# Patient Record
Sex: Female | Born: 1965 | Race: White | Hispanic: Yes | Marital: Single
Health system: Midwestern US, Community
[De-identification: ages and names within clinical notes are randomized; demographics above are authoritative.]

## PROBLEM LIST (undated history)

## (undated) DIAGNOSIS — D72829 Elevated white blood cell count, unspecified: Principal | ICD-10-CM

## (undated) DIAGNOSIS — E039 Hypothyroidism, unspecified: Secondary | ICD-10-CM

## (undated) DIAGNOSIS — R7989 Other specified abnormal findings of blood chemistry: Principal | ICD-10-CM

## (undated) DIAGNOSIS — E785 Hyperlipidemia, unspecified: Principal | ICD-10-CM

## (undated) DIAGNOSIS — Z1159 Encounter for screening for other viral diseases: Secondary | ICD-10-CM

## (undated) DIAGNOSIS — Z5181 Encounter for therapeutic drug level monitoring: Secondary | ICD-10-CM

---

## 2018-08-29 LAB — FECAL DNA COLORECTAL CANCER SCREENING (COLOGUARD): FIT-DNA (Cologuard): NEGATIVE

## 2022-03-13 ENCOUNTER — Emergency Department: Admit: 2022-03-14 | Payer: BLUE CROSS/BLUE SHIELD

## 2022-03-13 DIAGNOSIS — K0889 Other specified disorders of teeth and supporting structures: Secondary | ICD-10-CM

## 2022-03-13 NOTE — ED Provider Notes (Signed)
[facility] EMERGENCY DEPT  EMERGENCY DEPARTMENT HISTORY AND PHYSICAL EXAM      Date: 03/13/2022  Patient Name: Ashley Carey  MRN: [id number]  Birthdate 02-25-1966  Date of evaluation: 03/13/2022  Provider: Vladimir Crofts, MD   Note Started: 10:44 PM EDT 03/13/22    HISTORY OF PRESENT ILLNESS     Chief Complaint   Patient presents with    Dental Pain       History Provided By: Patient    HPI: Ashley Carey is a 56 y.o. female with PMH of anxiety, depression, and hypothyroidism who comes to the ED with dental pain and facial swelling.  She reports that she had surgery in her molars in the left lower side approximately 2 weeks ago.  Today she started having swelling in the jaw area in the neck area.  Nothing specific that makes it better or worse associated with some discomfort when she is moving her neck.  She reported chills at home but no fever.  Did not measure her temperature.  She called her surgeon today who told her everything is fine and she has follow-up which will be in September.  She denies any trouble with breathing, painful swallowing, or difficulty with opening her jaw.  She however is reporting that she has the discomfort when she is turning her neck to the left side.  She tried taking Tylenol and Motrin at home without significant movement and thus came to the ED.    PAST MEDICAL HISTORY   Past Medical History:  Past Medical History:   Diagnosis Date    Anxiety     Depression     Hypothyroid        Past Surgical History:  Past Surgical History:   Procedure Laterality Date    CHOLECYSTECTOMY      OVARY REMOVAL         Family History:  History reviewed. No pertinent family history.    Social History:  Social History     Tobacco Use    Smoking status: Never   Substance Use Topics    Alcohol use: Not Currently    Drug use: Never       Allergies:  Allergies   Allergen Reactions    Pcn [Penicillins] Dizziness or Vertigo       PCP: None None    Current Meds:   No current facility-administered medications for  this encounter.     Current Outpatient Medications   Medication Sig Dispense Refill    acetaminophen (TYLENOL) 500 MG tablet Take 2 tablets by mouth 3 times daily 40 tablet 0    amoxicillin (AMOXIL) 500 MG capsule Take 1 capsule by mouth 3 times daily for 7 days 21 capsule 0    chlorhexidine (PERIDEX) 0.12 % solution Swish and spit 15 mLs 3 times daily for 14 days 630 mL 0    ibuprofen (ADVIL;MOTRIN) 600 MG tablet Take 1 tablet by mouth every 6 hours as needed for Pain or Fever 20 tablet 0       Social Determinants of Health:   Social Determinants of Health     Tobacco Use: Unknown    Smoking Tobacco Use: Never    Smokeless Tobacco Use: Unknown    Passive Exposure: Not on file   Alcohol Use: Not on file   Financial Resource Strain: Not on file   Food Insecurity: Not on file   Transportation Needs: Not on file   Physical Activity: Not on file  Stress: Not on file   Social Connections: Not on file   Intimate Partner Violence: Not on file   Depression: Not on file   Housing Stability: Not on file       PHYSICAL EXAM   Physical Exam  Vitals and nursing note reviewed.   Constitutional:       General: She is not in acute distress.     Appearance: Normal appearance. She is not ill-appearing, toxic-appearing or diaphoretic.   HENT:      Head: Normocephalic and atraumatic.      Mouth/Throat:      Dentition: Abnormal dentition. Does not have dentures. No dental tenderness, gingival swelling, dental caries, dental abscesses or gum lesions.      Comments: Mild swelling at the jaw area on the left at the angle of mandible.  Mild tenderness to palpation.  No notation of range of motion.  No trismus.  Intraoral examination did not reveal any lesions.  Floor of the mouth is soft and nontender.  Eyes:      Extraocular Movements: Extraocular movements intact.      Conjunctiva/sclera: Conjunctivae normal.   Neck:      Vascular: No carotid bruit.   Cardiovascular:      Rate and Rhythm: Normal rate and regular rhythm.   Pulmonary:       Effort: Pulmonary effort is normal.      Breath sounds: Normal breath sounds.   Musculoskeletal:      Cervical back: Normal range of motion and neck supple. No rigidity or tenderness.   Lymphadenopathy:      Cervical: No cervical adenopathy.   Neurological:      Mental Status: She is alert and oriented to person, place, and time.       SCREENINGS               LAB, EKG AND DIAGNOSTIC RESULTS   Labs:  Recent Results (from the past 12 hour(s))   CBC with Auto Differential    Collection Time: 03/13/22 10:38 PM   Result Value Ref Range    WBC 14.3 (H) 3.6 - 11.0 K/uL    RBC 4.33 3.80 - 5.20 M/uL    Hemoglobin 12.5 11.5 - 16.0 g/dL    Hematocrit 91.4 78.2 - 47.0 %    MCV 89.1 80.0 - 99.0 FL    MCH 28.9 26.0 - 34.0 PG    MCHC 32.4 30.0 - 36.5 g/dL    RDW 95.6 21.3 - 08.6 %    Platelets 306 150 - 400 K/uL    MPV 9.6 8.9 - 12.9 FL    Nucleated RBCs 0.0 0.0 PER 100 WBC    nRBC 0.00 0.00 - 0.01 K/uL    Neutrophils % 55 32 - 75 %    Lymphocytes % 28 12 - 49 %    Monocytes % 4 (L) 5 - 13 %    Eosinophils % 0 0 - 7 %    Basophils % 0 0 - 1 %    Other cells, fluid 13 %    Immature Granulocytes 0 %    Neutrophils Absolute 7.9 1.8 - 8.0 K/UL    Lymphocytes Absolute 4.0 (H) 0.8 - 3.5 K/UL    Monocytes Absolute 0.6 0.0 - 1.0 K/UL    Eosinophils Absolute 0.0 0.0 - 0.4 K/UL    Basophils Absolute 0.0 0.0 - 0.1 K/UL    Absolute Immature Granulocyte 0.0 K/UL    Differential Type Manual  RBC Comment Normocytic, Normochromic     Basic Metabolic Panel    Collection Time: 03/13/22 10:38 PM   Result Value Ref Range    Sodium 139 136 - 145 mmol/L    Potassium 4.1 3.5 - 5.1 mmol/L    Chloride 105 97 - 108 mmol/L    CO2 30 21 - 32 mmol/L    Anion Gap 4 (L) 5 - 15 mmol/L    Glucose 103 (H) 65 - 100 mg/dL    BUN 17 6 - 20 mg/dL    Creatinine 4.781.00 2.950.55 - 1.02 mg/dL    Bun/Cre Ratio 17 12 - 20      Est, Glom Filt Rate >60 >60 ml/min/1.5973m2    Calcium 9.1 8.5 - 10.1 mg/dL       Radiologic Studies:  Non-plain film images such as CT, Ultrasound and  MRI are read by the radiologist. Plain radiographic images are visualized and preliminarily interpreted by the ED Provider with the following findings: See ED Course Below    Interpretation per the Radiologist below, if available at the time of this note:  CT MAXILLOFACIAL WO CONTRAST   Final Result   No acute fracture. No focal fluid collection visualized.               EMERGENCY DEPARTMENT COURSE and DIFFERENTIAL DIAGNOSIS/MDM   CC/HPI Summary, DDx, ED Course, and Reassessment:     Clinical Management Tools:  Not Applicable    Records Reviewed (source and summary of external notes): Prior medical records and Nursing notes    Vitals:    Vitals:    03/13/22 2215 03/14/22 0130   BP: (!) 156/85 (!) 148/76   Pulse: 79 86   Resp: 18 18   Temp: 98.4 F (36.9 C)    TempSrc: Oral    SpO2: 97% 98%   Weight: 113.4 kg (250 lb)    Height: 1.702 m (5\' 7" )      56 y.o. female with PMH of anxiety, depression, and hypothyroidism who comes to the ED with dental pain and facial swelling.  She is concerned that she has a dental infection.  I appreciate any abscess on her intraoral examination.  However, she could have an occult infection.  We will get CBC and chemistry as well as CT of the face to rule out the possibility of early infection within her jaw or face.  Currently, patient symptoms stable and not requiring any further interventions.     ED COURSE    Independently reviewed interpreted patient work-up.  CBC showed mild leukocytosis.  Patient report that her white blood cells usually mildly elevated.  Chemistry is unremarkable.  CT of the face was interpreted by the radiologist as no fracture or fluid collection visualized.  In the setting of her new onset of swelling this could be an early infection within her teeth giving the recent surgery.  Will discharge patient with antibiotics, analgesics and instructions to call her dentist in the morning for arrange expedited follow-up.    After completion of workup and discussion  of results and diagnoses with the patient, all the patient's questions were answered. If required, all follow up appointments and treatments were discussed and explained. The patient sounded understanding and agrees with the plan.    The patient understands that at this time there is no evidence for a more malignant underlying process, but the patient also understands that early in the process of an illness, an emergency department workup can be falsely reassuring.  Routine discharge counseling was given to the patient and the patient understands that worsening, changing or persistent symptoms should prompt an immediate call or follow up with their primary physician or the emergency department. The importance of appropriate follow up was also discussed with the patient. More extensive discharge instructions were given in the patient's discharge paperwork.      ED FINAL IMPRESSION     1. Pain, dental          DISPOSITION/PLAN   DISPOSITION Decision To Discharge 03/14/2022 01:32:48 AM    Discharge Note: The patient is stable for discharge home. The signs, symptoms, diagnosis, and discharge instructions have been discussed, understanding conveyed, and agreed upon. The patient is to follow up as recommended or return to ER should their symptoms worsen.      PATIENT REFERRED TO:  [facility] EMERGENCY DEPT  [address] [place] [address]  [phone]  Go to   As needed, If symptoms worsen      Call your dentist today to arrange expedited follow-up for reevaluation            DISCHARGE MEDICATIONS:     Medication List        START taking these medications      acetaminophen 500 MG tablet  Commonly known as: TYLENOL  Take 2 tablets by mouth 3 times daily     amoxicillin 500 MG capsule  Commonly known as: AMOXIL  Take 1 capsule by mouth 3 times daily for 7 days     chlorhexidine 0.12 % solution  Commonly known as: PERIDEX  Swish and spit 15 mLs 3 times daily for 14 days     ibuprofen 600 MG tablet  Commonly known  as: ADVIL;MOTRIN  Take 1 tablet by mouth every 6 hours as needed for Pain or Fever               Where to Get Your Medications        These medications were sent to [id number], [place] - [address] [phone] - F [phone]  [address], [place] [address]      Phone: [phone]   acetaminophen 500 MG tablet  amoxicillin 500 MG capsule  chlorhexidine 0.12 % solution  ibuprofen 600 MG tablet           DISCONTINUED MEDICATIONS:  Discharge Medication List as of 03/14/2022  1:47 AM          I am the Primary Clinician of Record. Laqueta Bonaventura Nils Pyle, MD (electronically signed)    (Please note that parts of this dictation were completed with voice recognition software. Quite often unanticipated grammatical, syntax, homophones, and other interpretive errors are inadvertently transcribed by the computer software. Please disregards these errors. Please excuse any errors that have escaped final proofreading.)     Vladimir Crofts, MD  03/14/22 [phone]

## 2022-03-13 NOTE — ED Triage Notes (Signed)
C/o continued dental pain to left lower after tooth being pulled 1 1/2 weeks ago; states noticed swelling to neck on left today

## 2022-03-14 ENCOUNTER — Inpatient Hospital Stay
Admit: 2022-03-14 | Discharge: 2022-03-14 | Disposition: A | Discharge status: Home / Self Care | Payer: BLUE CROSS/BLUE SHIELD | Attending: Emergency Medicine

## 2022-03-14 LAB — BASIC METABOLIC PANEL
Anion Gap: 4 mmol/L — ABNORMAL LOW (ref 5–15)
BUN: 17 mg/dL (ref 6–20)
Bun/Cre Ratio: 17 (ref 12–20)
CO2: 30 mmol/L (ref 21–32)
Calcium: 9.1 mg/dL (ref 8.5–10.1)
Chloride: 105 mmol/L (ref 97–108)
Creatinine: 1 mg/dL (ref 0.55–1.02)
Est, Glom Filt Rate: >60 mL/min/{1.73_m2} (ref >60)
Glucose: 103 mg/dL — ABNORMAL HIGH (ref 65–100)
Potassium: 4.1 mmol/L (ref 3.5–5.1)
Sodium: 139 mmol/L (ref 136–145)

## 2022-03-14 LAB — CBC WITH AUTO DIFFERENTIAL
Absolute Immature Granulocyte: 0 10*3/uL
Basophils %: 0 % (ref 0–1)
Basophils Absolute: 0 10*3/uL (ref 0.0–0.1)
Eosinophils %: 0 % (ref 0–7)
Eosinophils Absolute: 0 10*3/uL (ref 0.0–0.4)
Hematocrit: 38.6 % (ref 35.0–47.0)
Hemoglobin: 12.5 g/dL (ref 11.5–16.0)
Immature Granulocytes: 0 %
Lymphocytes %: 28 % (ref 12–49)
Lymphocytes Absolute: 4 10*3/uL — ABNORMAL HIGH (ref 0.8–3.5)
MCH: 28.9 PG (ref 26.0–34.0)
MCHC: 32.4 g/dL (ref 30.0–36.5)
MCV: 89.1 FL (ref 80.0–99.0)
MPV: 9.6 FL (ref 8.9–12.9)
Monocytes %: 4 % — ABNORMAL LOW (ref 5–13)
Monocytes Absolute: 0.6 10*3/uL (ref 0.0–1.0)
Neutrophils %: 55 % (ref 32–75)
Neutrophils Absolute: 7.9 10*3/uL (ref 1.8–8.0)
Nucleated RBCs: 0 PER 100 WBC
Other cells, fluid: 13 %
Platelets: 306 10*3/uL (ref 150–400)
RBC: 4.33 M/uL (ref 3.80–5.20)
RDW: 14.1 % (ref 11.5–14.5)
WBC: 14.3 10*3/uL — ABNORMAL HIGH (ref 3.6–11.0)
nRBC: 0 10*3/uL (ref 0.00–0.01)

## 2022-03-14 MED ORDER — CHLORHEXIDINE GLUCONATE 0.12 % MT SOLN
0.12 % | Freq: Three times a day (TID) | OROMUCOSAL | 0 refills | Status: DC
Start: 2022-03-14 — End: 2022-03-14

## 2022-03-14 MED ORDER — IBUPROFEN 600 MG PO TABS
600 MG | ORAL_TABLET | Freq: Four times a day (QID) | ORAL | 0 refills | Status: DC | PRN
Start: 2022-03-14 — End: 2022-03-14

## 2022-03-14 MED ORDER — IBUPROFEN 600 MG PO TABS
600 MG | ORAL_TABLET | Freq: Four times a day (QID) | ORAL | 0 refills | Status: DC | PRN
Start: 2022-03-14 — End: 2023-08-15

## 2022-03-14 MED ORDER — CHLORHEXIDINE GLUCONATE 0.12 % MT SOLN
0.12 % | Freq: Three times a day (TID) | OROMUCOSAL | 0 refills | Status: AC
Start: 2022-03-14 — End: 2022-03-28

## 2022-03-14 MED ORDER — AMOXICILLIN 500 MG PO CAPS
500 MG | ORAL_CAPSULE | Freq: Three times a day (TID) | ORAL | 0 refills | Status: AC
Start: 2022-03-14 — End: 2022-03-21

## 2022-03-14 MED ORDER — ACETAMINOPHEN 500 MG PO TABS
500 MG | ORAL_TABLET | Freq: Three times a day (TID) | ORAL | 0 refills | Status: DC
Start: 2022-03-14 — End: 2022-03-14

## 2022-03-14 MED ORDER — ACETAMINOPHEN 500 MG PO TABS
500 MG | ORAL_TABLET | Freq: Three times a day (TID) | ORAL | 0 refills | Status: DC
Start: 2022-03-14 — End: 2023-08-15

## 2022-03-14 MED ORDER — AMOXICILLIN 500 MG PO CAPS
500 MG | ORAL_CAPSULE | Freq: Three times a day (TID) | ORAL | 0 refills | Status: DC
Start: 2022-03-14 — End: 2022-03-14

## 2022-03-14 NOTE — ED Notes (Signed)
Provider at bedside for dispo and follow up, all treatments completed as ordered. Discharge plan reviewed and paperwork signed, teaching completed regarding treatment received, medications and follow up care demonstrated and read back, IV removed, catheter intact, pain level within manageable comfortable limits, ambulatory to exit, gait steady, safety maintained.       Verdia Kuba, RN  03/14/22 [phone]

## 2023-08-15 ENCOUNTER — Encounter

## 2023-08-15 ENCOUNTER — Ambulatory Visit: Admit: 2023-08-15 | Discharge: 2023-08-15 | Payer: BLUE CROSS/BLUE SHIELD | Attending: Acute Care | Primary: Acute Care

## 2023-08-15 VITALS — BP 133/82 | HR 84 | Temp 97.90000°F | Resp 18 | Ht 67.0 in | Wt 274.0 lb

## 2023-08-15 DIAGNOSIS — R899 Unspecified abnormal finding in specimens from other organs, systems and tissues: Secondary | ICD-10-CM

## 2023-08-15 MED ORDER — NYSTATIN 100000 UNIT/GM EX POWD
100000 | Freq: Four times a day (QID) | CUTANEOUS | 1 refills | 9.00000 days | Status: DC
Start: 2023-08-15 — End: 2024-06-15

## 2023-08-15 MED ORDER — LEVOTHYROXINE SODIUM 50 MCG PO TABS
50 MCG | ORAL_TABLET | Freq: Every day | ORAL | 0 refills | Status: DC
Start: 2023-08-15 — End: 2023-09-20

## 2023-08-15 NOTE — Assessment & Plan Note (Addendum)
 Chronic, not at goal (unstable), continue current plan pending work up below    Orders:    [facility] - Titus Dubin, DO, Orthopedic Surgery (hip), [facility]

## 2023-08-15 NOTE — Progress Notes (Signed)
 Subjective  Chief Complaint   Patient presents with    Establish Care    Weight Management    Medication Refill     HPI:  Ashley Carey is a 57 y.o. female patient presents limited past medical history including ADHD, anxiety, surgical correction o

## 2023-08-15 NOTE — Progress Notes (Signed)
 Chief Complaint   Patient presents with    Establish Care    Weight Management    Medication Refill         BP 133/82 (Site: Left Upper Arm, Position: Sitting)   Pulse 84   Temp 97.9 F (36.6 C) (Oral)   Resp 18   Ht 1.702 m (5\' 7" )   Wt 124.3 kg (274 lb)

## 2023-08-16 LAB — THYROID CASCADE PROFILE: TSH: 6.22 u[IU]/mL — ABNORMAL HIGH (ref 0.450–4.500)

## 2023-08-16 LAB — CBC WITH AUTO DIFFERENTIAL
Basophils %: 1 %
Basophils Absolute: 0.1 10*3/uL (ref 0.0–0.2)
Eosinophils %: 2 %
Eosinophils Absolute: 0.2 10*3/uL (ref 0.0–0.4)
Hematocrit: 39.6 % (ref 34.0–46.6)
Hemoglobin: 12.4 g/dL (ref 11.1–15.9)
Immature Grans (Abs): 0.2 10*3/uL — ABNORMAL HIGH (ref 0.0–0.1)
Immature Granulocytes %: 1 %
Lymphocytes %: 24 %
Lymphocytes Absolute: 2.8 10*3/uL (ref 0.7–3.1)
MCH: 27.6 pg (ref 26.6–33.0)
MCHC: 31.3 g/dL — ABNORMAL LOW (ref 31.5–35.7)
MCV: 88 fL (ref 79–97)
Monocytes %: 5 %
Monocytes Absolute: 0.6 10*3/uL (ref 0.1–0.9)
Neutrophils %: 67 %
Neutrophils Absolute: 7.9 10*3/uL — ABNORMAL HIGH (ref 1.4–7.0)
Platelets: 384 10*3/uL (ref 150–450)
RBC: 4.5 x10E6/uL (ref 3.77–5.28)
RDW: 13.6 % (ref 11.7–15.4)
WBC: 11.7 10*3/uL — ABNORMAL HIGH (ref 3.4–10.8)

## 2023-08-16 LAB — COMPREHENSIVE METABOLIC PANEL
ALT: 11 [IU]/L (ref 0–32)
AST: 16 [IU]/L (ref 0–40)
Albumin: 4.1 g/dL (ref 3.8–4.9)
Alkaline Phosphatase: 135 [IU]/L — ABNORMAL HIGH (ref 44–121)
BUN/Creatinine Ratio: 11 (ref 9–23)
BUN: 10 mg/dL (ref 6–24)
CO2: 27 mmol/L (ref 20–29)
Calcium: 9.6 mg/dL (ref 8.7–10.2)
Chloride: 97 mmol/L (ref 96–106)
Creatinine: 0.87 mg/dL (ref 0.57–1.00)
Est, Glom Filt Rate: 77 mL/min/{1.73_m2} (ref >59)
Globulin, Total: 2.9 g/dL (ref 1.5–4.5)
Glucose: 84 mg/dL (ref 70–99)
Potassium: 3.6 mmol/L (ref 3.5–5.2)
Sodium: 140 mmol/L (ref 134–144)
Total Bilirubin: 0.2 mg/dL (ref 0.0–1.2)
Total Protein: 7 g/dL (ref 6.0–8.5)

## 2023-08-16 LAB — THYROID PEROXIDASE ANTIBODY: Thyroid Peroxidase (TPO) Abs: <9 [IU]/mL (ref 0–34)

## 2023-08-16 LAB — HEPATITIS C ANTIBODY: Hepatitis C Ab: NONREACTIVE

## 2023-08-16 LAB — LIPID PANEL
Cholesterol, Total: 187 mg/dL (ref 100–199)
HDL: 44 mg/dL (ref >39)
LDL Cholesterol: 117 mg/dL — ABNORMAL HIGH (ref 0–99)
Triglycerides: 147 mg/dL (ref 0–149)
VLDL Cholesterol Calculated: 26 mg/dL (ref 5–40)

## 2023-08-16 LAB — T4, FREE DIRECT: T4 Free: 0.83 ng/dL (ref 0.82–1.77)

## 2023-08-16 NOTE — Telephone Encounter (Signed)
 Patient called regarding referral. Patient stated she is intersted in [facility]. Would like to try this route prior to [facility] options. Sent medical weight loss orientation via e-mail

## 2023-08-16 NOTE — Addendum Note (Signed)
 Addended by: Deborra Medina on: 08/16/2023 10:55 AM     Modules accepted: Orders

## 2023-08-23 NOTE — Telephone Encounter (Signed)
 Patient called in and stated that her estimate for the December visit is unusually high and said that she has the same insurance. Told patient to call billing but she insisted on speaking with a nurse, please advise

## 2023-09-13 ENCOUNTER — Encounter

## 2023-09-17 ENCOUNTER — Encounter: Attending: Acute Care | Primary: Acute Care

## 2023-09-19 ENCOUNTER — Encounter: Payer: BLUE CROSS/BLUE SHIELD | Attending: Acute Care | Primary: Acute Care

## 2023-09-20 MED ORDER — LEVOTHYROXINE SODIUM 50 MCG PO TABS
50 | ORAL_TABLET | Freq: Every day | ORAL | 0 refills | Status: AC
Start: 2023-09-20 — End: 2023-12-19

## 2023-10-08 ENCOUNTER — Encounter: Payer: BLUE CROSS/BLUE SHIELD | Attending: Acute Care | Primary: Acute Care

## 2023-10-21 NOTE — Telephone Encounter (Signed)
-----   Message from [place] U sent at 10/21/2023  9:04 AM EST -----  Regarding: [facility] Appointment Request  [facility] Appointment Request    Patient needs appointment for [facility] Appointment Type: Existing Condition Follow Up.    Patient Requested Dates(s): First Available   Patient Requested Time: Any time  Provider Name: No preferred Doctor     Reason for Appointment Request: Established Patient - No appointments available during search  Additional Information: Patient want to make an appointment for her thyroid check up and also to renewed her prescription, mention that her PCP is out at the office and not sure when she came back, no preferred doctor.  --------------------------------------------------------------------------------------------------------------------------    Relationship to Patient: Self     Call Back Information: OK to leave message on voicemail  Preferred Call Back Number: Phone: [phone]

## 2023-10-23 ENCOUNTER — Encounter: Payer: BLUE CROSS/BLUE SHIELD | Attending: Acute Care | Primary: Acute Care

## 2023-10-30 NOTE — Telephone Encounter (Signed)
Levothyroxine sent to pharmacy 09/20/2023 for a qty 90

## 2023-10-30 NOTE — Telephone Encounter (Signed)
Patient was a current patient with DNP Nedra Ashley Carey and will be following up with Dr. Aldean Jewett as her PCP. She is currently out of her       levothyroxine (SYNTHROID) 50 MCG tablet       She needs a refill. Send to [facility]

## 2023-12-18 ENCOUNTER — Encounter

## 2023-12-19 MED ORDER — LEVOTHYROXINE SODIUM 50 MCG PO TABS
50 MCG | ORAL_TABLET | Freq: Every day | ORAL | 0 refills | Status: DC
Start: 2023-12-19 — End: 2024-03-02

## 2024-03-02 ENCOUNTER — Encounter

## 2024-03-02 ENCOUNTER — Ambulatory Visit
Admit: 2024-03-02 | Discharge: 2024-03-02 | Payer: BLUE CROSS/BLUE SHIELD | Attending: Student in an Organized Health Care Education/Training Program | Primary: Student in an Organized Health Care Education/Training Program

## 2024-03-02 VITALS — BP 133/88 | HR 84 | Temp 98.70000°F | Resp 18 | Ht 67.0 in | Wt 265.5 lb

## 2024-03-02 DIAGNOSIS — F419 Anxiety disorder, unspecified: Secondary | ICD-10-CM

## 2024-03-02 LAB — AMB POC HEMOGLOBIN A1C: Hemoglobin A1C, POC: 5.7 %

## 2024-03-02 MED ORDER — METFORMIN HCL ER 500 MG PO TB24
500 | ORAL_TABLET | Freq: Two times a day (BID) | ORAL | 5 refills | 90.00000 days | Status: DC
Start: 2024-03-02 — End: 2024-06-15

## 2024-03-02 MED ORDER — LEVOTHYROXINE SODIUM 50 MCG PO TABS
50 | ORAL_TABLET | Freq: Every day | ORAL | 0 refills | 90.00000 days | Status: DC
Start: 2024-03-02 — End: 2024-04-06

## 2024-03-02 NOTE — Progress Notes (Signed)
"  Have you been to the ER, urgent care clinic since your last visit?  Hospitalized since your last visit?"    NO    "Have you seen or consulted any other health care providers outside of [facility] since your last visit?"    YES - When: approximately April 2025 ago.  Where and Why: Follow up with weight loss clinic.    Have you had a mammogram?"   NO    No breast cancer screening on file         "Have you had a colorectal cancer screening such as a colonoscopy/FIT/Cologuard?    NO    No colonoscopy on file  No cologuard on file  No FIT/FOBT on file   No flexible sigmoidoscopy on file     Chief Complaint   Patient presents with    Establish Care    Follow-up Chronic Condition     BP (!) 147/97 (BP Site: Right Upper Arm, Patient Position: Sitting, BP Cuff Size: Medium Adult)   Pulse 84   Temp 98.7 F (37.1 C) (Temporal)   Resp 18   Ht 1.702 m (5\' 7" )   Wt 120.4 kg (265 lb 8 oz)   SpO2 97%   BMI 41.58 kg/m       Click Here for Release of Records Request

## 2024-03-02 NOTE — Progress Notes (Signed)
 Ashley Carey (DOB: 1965/11/28) is a 58 y.o. female, Established patient patient, here for evaluation of the following chief complaint(s):  Establish Care and Follow-up Chronic Condition       ASSESSMENT/PLAN:  Below is the assessment and plan developed based on review of pertinent history, physical exam, labs, studies, and medications.    1. Anxiety  2. Depression, unspecified depression type  3. Attention deficit hyperactivity disorder (ADHD), unspecified ADHD type  4. Hypothyroidism, unspecified type  -     TSH; Future  -     T4, Free; Future  -     levothyroxine  (SYNTHROID ) 50 MCG tablet; Take 1 tablet by mouth daily 1 hour before breakfast, Disp-30 tablet, R-0Normal  5. Prediabetes  -     metFORMIN (GLUCOPHAGE-XR) 500 MG extended release tablet; Take 2 tablets by mouth with breakfast and with evening meal TAKE 2 TABLETS BY MOUTH EVERY MORNING AND 2 TABLETS BY MOUTH EVERY EVENING WITH FOOD, Disp-120 tablet, R-5Normal  -     AMB POC HEMOGLOBIN A1C  6. Medication monitoring encounter  -     CBC; Future  -     Comprehensive Metabolic Panel; Future  7. Screening for hyperlipidemia  -     Lipid Panel; Future  8. Screening for colon cancer  -     Cologuard (Fecal DNA Colorectal Cancer Screening)    First visit with me     Continue follow-up with psychiatrist.  Blood work as above, given refills.    She has a family hx of breast cancer- mother- refusing to get mammograms, last mammogram was before 2014.   Refusing referral for pap smear.     Prediabetes: A1c 5.7, continue metformin, continue lifestyle change    No follow-ups on file.         SUBJECTIVE/OBJECTIVE:  HPI    [name] is 58 yr old who presents to establish care.   Established patient of the practice. Previously under care of DNP Lee, first visit with me   She has hypothyroidism, anxiety, depression, ADHD.  She follows with psychiatry, she has prediabetes, on metformin which was being prescribed through an online physician for weight loss.  She  underwent left ankle reconstructive surgery after an injury at work in July 2024.   She still has intermittent swelling of the ankle.        Allergies   Allergen Reactions    Ammonia      Other Reaction(s): confusion, dizziness, lightheadedness, respiratory distress, ringing in ears    Amoxicillin  Dizziness or Vertigo and Shortness Of Breath     Other Reaction(s): decreased blood pressure, lightheadedness    Cynara Scolymus (Artichoke) Anaphylaxis    Hazelnut Anaphylaxis    Pineapple Extract Diarrhea and Itching    Peanut-Containing Drug Products Swelling    Pcn [Penicillins] Dizziness or Vertigo    Sulfa Antibiotics Diarrhea and Nausea And Vomiting     Current Outpatient Medications   Medication Sig Dispense Refill    meloxicam (MOBIC) 15 MG tablet Take 1 tablet by mouth daily      levothyroxine  (SYNTHROID ) 50 MCG tablet Take 1 tablet by mouth daily 1 hour before breakfast 30 tablet 0    metFORMIN (GLUCOPHAGE-XR) 500 MG extended release tablet Take 2 tablets by mouth with breakfast and with evening meal TAKE 2 TABLETS BY MOUTH EVERY MORNING AND 2 TABLETS BY MOUTH EVERY EVENING WITH FOOD 120 tablet 5    QUEtiapine (SEROQUEL) 50 MG tablet Take 1 tablet by mouth  nightly      lurasidone (LATUDA) 80 MG TABS tablet Take 1 tablet by mouth Daily with supper      amphetamine-dextroamphetamine (ADDERALL XR) 30 MG extended release capsule Take 1 tablet by mouth daily. Max Daily Amount: 1 tablet      buPROPion (WELLBUTRIN XL) 300 MG extended release tablet Take 1 tablet by mouth every morning      desvenlafaxine succinate (PRISTIQ) 100 MG TB24 extended release tablet Take 1 tablet by mouth daily      desvenlafaxine succinate (PRISTIQ) 50 MG TB24 extended release tablet Take 1 tablet by mouth daily      nystatin  (MYCOSTATIN ) 100000 UNIT/GM powder Apply topically 4 times daily 60 g 1    benztropine (COGENTIN) 1 MG tablet Take 1 tablet by mouth nightly       No current facility-administered medications for this visit.      Past  Medical History:   Diagnosis Date    ADHD (attention deficit hyperactivity disorder) 2011    Anxiety     Broken ankle, left, closed, with delayed healing, subsequent encounter 08/15/2023    Depression     Fracture of distal end of fibula 04/25/2022    Hypothyroid     Morbid obesity with BMI of 40.0-44.9, adult (HCC)     Obesity 1988    Sleep apnea 2014     Past Surgical History:   Procedure Laterality Date    CHOLECYSTECTOMY      EYE SURGERY  2016    OVARY REMOVAL       Family History   Problem Relation Age of Onset    Breast Cancer Mother     Heart Disease Mother     Allergy (Severe) Mother     Depression Mother     Diabetes Mother     Gout Mother     Obesity Mother     Prostate Cancer Father     Mental Illness Sister     Depression Sister     Mental Illness Brother     Cancer Maternal Grandfather     Cancer Maternal Grandmother     Diabetes Maternal Grandmother     Heart Disease Maternal Grandmother     Cancer Paternal Grandfather     Stroke Paternal Grandmother     Breast Cancer Paternal Aunt     Heart Disease Paternal Aunt     Breast Cancer Paternal Aunt     Stroke Maternal Aunt      Social History     Tobacco Use   Smoking Status Every Day    Current packs/day: 1.00    Average packs/day: 1 pack/day for 15.0 years (15.0 ttl pk-yrs)    Types: Pipe, Cigarettes    Passive exposure: Never   Smokeless Tobacco Never   Tobacco Comments    Vape6 yr         Review of Systems   Constitutional:  Negative for fever.   Respiratory:  Negative for cough and shortness of breath.    Cardiovascular:  Negative for chest pain and palpitations.   Gastrointestinal:  Negative for abdominal pain.   Neurological:  Negative for dizziness, seizures and syncope.   Psychiatric/Behavioral:  Negative for agitation and behavioral problems.            Vitals:    03/02/24 0802 03/02/24 0817   BP: (!) 147/97 133/88   BP Site: Right Upper Arm Left Upper Arm   Patient Position: Sitting Sitting   BP Cuff Size:  Medium Adult Medium Adult   Pulse: 84  84   Resp: 18    Temp: 98.7 F (37.1 C)    TempSrc: Temporal    SpO2: 97%    Weight: 120.4 kg (265 lb 8 oz)    Height: 1.702 m (5\' 7" )       Physical Exam  Constitutional:       Appearance: Normal appearance.   Cardiovascular:      Rate and Rhythm: Normal rate and regular rhythm.   Pulmonary:      Effort: Pulmonary effort is normal.      Breath sounds: Normal breath sounds.   Neurological:      General: No focal deficit present.      Mental Status: She is alert. Mental status is at baseline.   Psychiatric:         Mood and Affect: Mood normal.         Behavior: Behavior normal.                 An electronic signature was used to authenticate this note.  -- Chelsea Cordia, MD

## 2024-03-08 LAB — COMPREHENSIVE METABOLIC PANEL
ALT: 17 IU/L (ref 0–32)
AST: 20 IU/L (ref 0–40)
Albumin: 4.3 g/dL (ref 3.8–4.9)
Alkaline Phosphatase: 136 IU/L — ABNORMAL HIGH (ref 44–121)
BUN/Creatinine Ratio: 12 (ref 12–28)
BUN: 12 mg/dL (ref 8–27)
CO2: 23 mmol/L (ref 20–29)
Calcium: 9.6 mg/dL (ref 8.7–10.3)
Chloride: 102 mmol/L (ref 96–106)
Creatinine: 0.98 mg/dL (ref 0.57–1.00)
Est, Glom Filt Rate: 66 mL/min/{1.73_m2} (ref >59)
Globulin, Total: 2.6 g/dL (ref 1.5–4.5)
Glucose: 116 mg/dL — ABNORMAL HIGH (ref 70–99)
Potassium: 5.2 mmol/L (ref 3.5–5.2)
Sodium: 141 mmol/L (ref 134–144)
Total Bilirubin: 0.2 mg/dL (ref 0.0–1.2)
Total Protein: 6.9 g/dL (ref 6.0–8.5)

## 2024-03-08 LAB — LIPID PANEL
Cholesterol, Total: 210 mg/dL — ABNORMAL HIGH (ref 100–199)
HDL: 46 mg/dL (ref >39)
LDL Cholesterol: 133 mg/dL — ABNORMAL HIGH (ref 0–99)
Triglycerides: 172 mg/dL — ABNORMAL HIGH (ref 0–149)
VLDL Cholesterol Calculated: 31 mg/dL (ref 5–40)

## 2024-03-08 LAB — T4, FREE: T4 Free: 1.07 ng/dL (ref 0.82–1.77)

## 2024-03-08 LAB — CBC
Hematocrit: 40.6 % (ref 34.0–46.6)
Hemoglobin: 12.9 g/dL (ref 11.1–15.9)
MCH: 30.1 pg (ref 26.6–33.0)
MCHC: 31.8 g/dL (ref 31.5–35.7)
MCV: 95 fL (ref 79–97)
Platelets: 365 10*3/uL (ref 150–450)
RBC: 4.28 x10E6/uL (ref 3.77–5.28)
RDW: 13.8 % (ref 11.7–15.4)
WBC: 13.4 10*3/uL — ABNORMAL HIGH (ref 3.4–10.8)

## 2024-03-08 LAB — TSH: TSH: 3.29 u[IU]/mL (ref 0.450–4.500)

## 2024-03-26 ENCOUNTER — Encounter

## 2024-03-26 MED ORDER — PRAVASTATIN SODIUM 20 MG PO TABS
20 | ORAL_TABLET | Freq: Every day | ORAL | 2 refills | 90.00000 days | Status: DC
Start: 2024-03-26 — End: 2024-06-15

## 2024-03-28 LAB — FECAL DNA COLORECTAL CANCER SCREENING (COLOGUARD): FIT-DNA (Cologuard): NEGATIVE

## 2024-04-02 ENCOUNTER — Encounter

## 2024-04-06 MED ORDER — LEVOTHYROXINE SODIUM 50 MCG PO TABS
50 | ORAL_TABLET | Freq: Every day | ORAL | 2 refills | 90.00000 days | Status: DC
Start: 2024-04-06 — End: 2024-06-15

## 2024-04-26 LAB — CBC WITH AUTO DIFFERENTIAL
Basophils %: 1 %
Basophils Absolute: 0.1 x10E3/uL (ref 0.0–0.2)
Eosinophils %: 2 %
Eosinophils Absolute: 0.3 x10E3/uL (ref 0.0–0.4)
Hematocrit: 39.7 % (ref 34.0–46.6)
Hemoglobin: 12.4 g/dL (ref 11.1–15.9)
Immature Grans (Abs): 0.3 x10E3/uL — ABNORMAL HIGH (ref 0.0–0.1)
Immature Granulocytes %: 2 %
Lymphocytes %: 23 %
Lymphocytes Absolute: 3.3 x10E3/uL — ABNORMAL HIGH (ref 0.7–3.1)
MCH: 29.2 pg (ref 26.6–33.0)
MCHC: 31.2 g/dL — ABNORMAL LOW (ref 31.5–35.7)
MCV: 94 fL (ref 79–97)
Monocytes %: 5 %
Monocytes Absolute: 0.8 x10E3/uL (ref 0.1–0.9)
Neutrophils %: 67 %
Neutrophils Absolute: 9.7 x10E3/uL — ABNORMAL HIGH (ref 1.4–7.0)
Platelets: 373 x10E3/uL (ref 150–450)
RBC: 4.24 x10E6/uL (ref 3.77–5.28)
RDW: 13.4 % (ref 11.7–15.4)
WBC: 14.4 x10E3/uL — ABNORMAL HIGH (ref 3.4–10.8)

## 2024-05-18 NOTE — Telephone Encounter (Signed)
 error

## 2024-05-18 NOTE — Telephone Encounter (Signed)
 Patient called and is returning call.  Please call patient.

## 2024-05-18 NOTE — Telephone Encounter (Signed)
 Patient called back and stated the number clinical gave her states out of order.  Can clinical call patient back and advise of another number to call?

## 2024-05-18 NOTE — Telephone Encounter (Signed)
 Patient called back and would like a return call from clinical concerning labs.

## 2024-05-18 NOTE — Telephone Encounter (Signed)
 Advised patient of need to see hematologist and gave her the phone number. SM LPN

## 2024-06-15 ENCOUNTER — Encounter

## 2024-06-15 ENCOUNTER — Ambulatory Visit
Admit: 2024-06-15 | Discharge: 2024-06-15 | Payer: BLUE CROSS/BLUE SHIELD | Attending: Student in an Organized Health Care Education/Training Program | Primary: Student in an Organized Health Care Education/Training Program

## 2024-06-15 VITALS — BP 130/89 | HR 97 | Temp 97.20000°F | Resp 18 | Ht 67.0 in | Wt 261.6 lb

## 2024-06-15 DIAGNOSIS — R7303 Prediabetes: Principal | ICD-10-CM

## 2024-06-15 LAB — AMB POC HEMOGLOBIN A1C: Hemoglobin A1C, POC: 5.7 %

## 2024-06-15 MED ORDER — LEVOTHYROXINE SODIUM 50 MCG PO TABS
50 | ORAL_TABLET | Freq: Every day | ORAL | 2 refills | 90.00000 days | Status: DC
Start: 2024-06-15 — End: 2024-08-25

## 2024-06-15 MED ORDER — TIRZEPATIDE-WEIGHT MANAGEMENT 7.5 MG/0.5ML SC SOAJ
7.5 | SUBCUTANEOUS | 1 refills | 28.00000 days | Status: DC
Start: 2024-06-15 — End: 2024-08-25

## 2024-06-15 MED ORDER — PRAVASTATIN SODIUM 20 MG PO TABS
20 | ORAL_TABLET | Freq: Every day | ORAL | 2 refills | 90.00000 days | Status: DC
Start: 2024-06-15 — End: 2024-08-25

## 2024-06-15 MED ORDER — TIRZEPATIDE-WEIGHT MANAGEMENT 5 MG/0.5ML SC SOAJ
5 | SUBCUTANEOUS | 0 refills | 28.00000 days | Status: DC
Start: 2024-06-15 — End: 2024-08-25

## 2024-06-15 NOTE — Progress Notes (Signed)
 Ashley Carey (DOB: July 02, 1966) is a 58 y.o. female, Established patient patient, here for evaluation of the following chief complaint(s):  Follow-up Chronic Condition and Weight Management       ASSESSMENT/PLAN:  Below is the assessment and plan developed based on review of pertinent history, physical exam, labs, studies, and medications.    1. Prediabetes  -     AMB POC HEMOGLOBIN A1C  -     Comprehensive Metabolic Panel; Future  2. Hypothyroidism, unspecified type  -     Comprehensive Metabolic Panel; Future  -     T4, Free; Future  -     TSH; Future  -     levothyroxine  (SYNTHROID ) 50 MCG tablet; Take 1 tablet by mouth daily 1 hour before breakfast, Disp-90 tablet, R-2Normal  3. Morbid obesity with BMI of 40.0-44.9, adult (HCC)  -     tirzepatide -weight management (ZEPBOUND ) 5 MG/0.5ML SOAJ subCUTAneous auto-injector pen; Inject 5 mg into the skin every 7 days SECOND MONTH, Disp-2 mL, R-0Normal  -     tirzepatide -weight management (ZEPBOUND ) 7.5 MG/0.5ML SOAJ subCUTAneous auto-injector pen; Inject 7.5 mg into the skin every 7 days THIRD MONTH, Disp-2 mL, R-1Normal  4. Hyperlipidemia, unspecified hyperlipidemia type  -     Lipid Panel; Future  -     pravastatin  (PRAVACHOL ) 20 MG tablet; Take 1 tablet by mouth daily, Disp-90 tablet, R-2Normal  5. Medication monitoring encounter  -     Comprehensive Metabolic Panel; Future  6. Anxiety  7. Attention deficit hyperactivity disorder (ADHD), unspecified ADHD type    Prediabetes: Continue lifestyle changes  Results for orders placed or performed in visit on 06/15/24   AMB POC HEMOGLOBIN A1C   Result Value Ref Range    Hemoglobin A1C, POC 5.7 %     Morbid obesity: current weight 261 lbs, BMI 41.1 , was 264 when she started zepbound  2.5 mg    Diet: has tried weight watchers, [place] diet, low fat, low carb   Medications: topamax in the past, would not be a candidate for phentermine given anxiety, on adderall    Exercise: left ankle pain after injury and unable to do  much exercise due to pain, prior to that yoga, walking     Last Weight Metrics:      06/15/2024     3:49 PM 03/02/2024     8:02 AM 08/15/2023     8:06 AM 03/13/2022    10:15 PM   Weight Loss Metrics   Height 5' 7 5' 7 5' 7 5' 7   Weight - Scale 261 lbs 10 oz 265 lbs 8 oz 274 lbs 250 lbs   BMI (Calculated) 41.1 kg/m2 41.7 kg/m2 43 kg/m2  39.2 kg/m2        Data saved with a previous flowsheet row definition   Zepbound  has been sent in  Has an appointment with hematologist due to leucocytosis   Labs as above.  Continue follow-up with psychiatry.    Refused mammogram.  Up-to-date with Colog[facility]  Return in about 2 months (around 08/15/2024) for follow up for weight loss .         SUBJECTIVE/OBJECTIVE:  HPI    Ashley Carey is a 58 year old who presents to clinic for follow-up.  She has hypothyroidism, anxiety, depression, ADHD.  She follows with psychiatry, she has prediabetes, on metformin  which was being prescribed through an online physician for weight loss. She had to change weight loss clinics, and was started on zepbound , she  was started on 2.5 mg dose. She states its expensive to go the weight loss clinic, asking if I can send this refill.     She has a family hx of breast cancer- mother- refused to get mammograms, last mammogram was  2018.   refused referral for pap smear.     She underwent left ankle reconstructive surgery after an injury at work in July 2024.   Allergies   Allergen Reactions    Ammonia      Other Reaction(s): confusion, dizziness, lightheadedness, respiratory distress, ringing in ears    Amoxicillin  Dizziness or Vertigo and Shortness Of Breath     Other Reaction(s): decreased blood pressure, lightheadedness    Cynara Scolymus (Artichoke) Anaphylaxis    Hazelnut Anaphylaxis    Pineapple Extract Diarrhea and Itching    Peanut-Containing Drug Products Swelling    Pcn [Penicillins] Dizziness or Vertigo    Sulfa Antibiotics Diarrhea and Nausea And Vomiting     Current Outpatient Medications    Medication Sig Dispense Refill    carisoprodol (SOMA) 350 MG tablet Take 1 tablet by mouth at bedtime.      tirzepatide -weight management (ZEPBOUND ) 5 MG/0.5ML SOAJ subCUTAneous auto-injector pen Inject 5 mg into the skin every 7 days SECOND MONTH 2 mL 0    tirzepatide -weight management (ZEPBOUND ) 7.5 MG/0.5ML SOAJ subCUTAneous auto-injector pen Inject 7.5 mg into the skin every 7 days THIRD MONTH 2 mL 1    pravastatin  (PRAVACHOL ) 20 MG tablet Take 1 tablet by mouth daily 90 tablet 2    levothyroxine  (SYNTHROID ) 50 MCG tablet Take 1 tablet by mouth daily 1 hour before breakfast 90 tablet 2    QUEtiapine (SEROQUEL) 50 MG tablet Take 1 tablet by mouth nightly      lurasidone (LATUDA) 80 MG TABS tablet Take 1 tablet by mouth Daily with supper      amphetamine-dextroamphetamine (ADDERALL XR) 30 MG extended release capsule Take 1 tablet by mouth daily. Max Daily Amount: 1 tablet      buPROPion (WELLBUTRIN XL) 300 MG extended release tablet Take 1 tablet by mouth every morning      desvenlafaxine succinate (PRISTIQ) 100 MG TB24 extended release tablet Take 1 tablet by mouth daily      desvenlafaxine succinate (PRISTIQ) 50 MG TB24 extended release tablet Take 1 tablet by mouth daily       No current facility-administered medications for this visit.      Past Medical History:   Diagnosis Date    ADHD (attention deficit hyperactivity disorder) 2011    Anxiety     Broken ankle, left, closed, with delayed healing, subsequent encounter 08/15/2023    Depression     Fracture of distal end of fibula 04/25/2022    Hypothyroid     Morbid obesity with BMI of 40.0-44.9, adult (HCC)     Obesity 1988    Sleep apnea 2014     Past Surgical History:   Procedure Laterality Date    CHOLECYSTECTOMY      EYE SURGERY  2016    FRACTURE SURGERY  2023    OVARY REMOVAL       Family History   Problem Relation Age of Onset    Breast Cancer Mother     Heart Disease Mother     Allergy (Severe) Mother     Depression Mother     Diabetes Mother     Gout  Mother     Obesity Mother     Prostate Cancer Father  Coronary Art Dis Father 70 - 27    Heart Disease Father 81 - 59    Other Father     Mental Illness Sister     Depression Sister     Mental Illness Brother     Depression Brother 24 - 62    Cancer Maternal Grandfather     Cancer Maternal Grandmother     Diabetes Maternal Grandmother     Heart Disease Maternal Grandmother     Cancer Paternal Grandfather     Stroke Paternal Grandmother     Breast Cancer Paternal Aunt     Heart Disease Paternal Aunt     Breast Cancer Paternal Aunt     Stroke Maternal Aunt      Social History     Tobacco Use   Smoking Status Every Day    Current packs/day: 1.00    Average packs/day: 1 pack/day for 15.0 years (15.0 ttl pk-yrs)    Types: Cigarettes    Passive exposure: Never   Smokeless Tobacco Never   Tobacco Comments    Vape6 yr         Review of Systems   Constitutional:  Negative for fever.   Respiratory:  Negative for cough and shortness of breath.    Cardiovascular:  Negative for chest pain and palpitations.   Gastrointestinal:  Negative for abdominal pain.   Neurological:  Negative for dizziness, seizures and syncope.   Psychiatric/Behavioral:  Negative for agitation and behavioral problems.            Vitals:    06/15/24 1549   BP: 130/89   BP Site: Right Upper Arm   Patient Position: Sitting   BP Cuff Size: Large Adult   Pulse: 97   Resp: 18   Temp: 97.2 F (36.2 C)   TempSrc: Temporal   SpO2: 97%   Weight: 118.7 kg (261 lb 9.6 oz)   Height: 1.702 m (5' 7)      Physical Exam  Constitutional:       Appearance: Normal appearance.   Cardiovascular:      Rate and Rhythm: Normal rate and regular rhythm.   Pulmonary:      Effort: Pulmonary effort is normal.      Breath sounds: Normal breath sounds.   Neurological:      General: No focal deficit present.      Mental Status: She is alert. Mental status is at baseline.   Psychiatric:         Mood and Affect: Mood normal.         Behavior: Behavior normal.                 An  electronic signature was used to authenticate this note.  -- Charles Golas, MD

## 2024-06-15 NOTE — Progress Notes (Unsigned)
 Have you been to the ER, urgent care clinic since your last visit?  Hospitalized since your last visit?    NO    "Have you seen or consulted any other health care providers outside of [facility] since your last visit?"    NO    Have you had a mammogram?"   NO    Date of last Mammogram: 05/29/2017       Chief Complaint   Patient presents with    Follow-up Chronic Condition    Weight Management     BP 130/89 (BP Site: Right Upper Arm, Patient Position: Sitting, BP Cuff Size: Large Adult)   Pulse 97   Temp 97.2 F (36.2 C) (Temporal)   Resp 18   Ht 1.702 m (5' 7)   Wt 118.7 kg (261 lb 9.6 oz)   SpO2 97%   BMI 40.97 kg/m         Click Here for Release of Records Request

## 2024-08-02 LAB — COMPREHENSIVE METABOLIC PANEL
ALT: 16 IU/L (ref 0–32)
AST: 21 IU/L (ref 0–40)
Albumin: 4.3 g/dL (ref 3.8–4.9)
Alkaline Phosphatase: 118 IU/L (ref 49–135)
BUN/Creatinine Ratio: 8 — ABNORMAL LOW (ref 12–28)
BUN: 9 mg/dL (ref 8–27)
CO2: 24 mmol/L (ref 20–29)
Calcium: 9.6 mg/dL (ref 8.7–10.3)
Chloride: 100 mmol/L (ref 96–106)
Creatinine: 1.14 mg/dL — ABNORMAL HIGH (ref 0.57–1.00)
Est, Glom Filt Rate: 55 mL/min/1.73 — ABNORMAL LOW (ref >59)
Globulin, Total: 2.7 g/dL (ref 1.5–4.5)
Glucose: 115 mg/dL — ABNORMAL HIGH (ref 70–99)
Potassium: 4.4 mmol/L (ref 3.5–5.2)
Sodium: 140 mmol/L (ref 134–144)
Total Bilirubin: 0.3 mg/dL (ref 0.0–1.2)
Total Protein: 7 g/dL (ref 6.0–8.5)

## 2024-08-02 LAB — LIPID PANEL
Cholesterol, Total: 171 mg/dL (ref 100–199)
HDL: 48 mg/dL (ref >39)
LDL Cholesterol: 88 mg/dL (ref 0–99)
Triglycerides: 208 mg/dL — ABNORMAL HIGH (ref 0–149)
VLDL Cholesterol Calculated: 35 mg/dL (ref 5–40)

## 2024-08-02 LAB — TSH: TSH: 2.54 u[IU]/mL (ref 0.450–4.500)

## 2024-08-02 LAB — T4, FREE: T4 Free: 0.96 ng/dL (ref 0.82–1.77)

## 2024-08-25 ENCOUNTER — Encounter

## 2024-08-25 ENCOUNTER — Ambulatory Visit
Admit: 2024-08-25 | Discharge: 2024-08-25 | Payer: BLUE CROSS/BLUE SHIELD | Attending: Student in an Organized Health Care Education/Training Program | Primary: Student in an Organized Health Care Education/Training Program

## 2024-08-25 VITALS — BP 126/90 | HR 76 | Temp 98.00000°F | Resp 20 | Ht 67.0 in | Wt 259.0 lb

## 2024-08-25 DIAGNOSIS — R7989 Other specified abnormal findings of blood chemistry: Principal | ICD-10-CM

## 2024-08-25 MED ORDER — METFORMIN HCL ER 500 MG PO TB24
500 | ORAL_TABLET | Freq: Every day | ORAL | 1 refills | 90.00000 days | Status: AC
Start: 2024-08-25 — End: ?

## 2024-08-25 MED ORDER — PRAVASTATIN SODIUM 20 MG PO TABS
20 | ORAL_TABLET | Freq: Every day | ORAL | 2 refills | 90.00000 days | Status: AC
Start: 2024-08-25 — End: ?

## 2024-08-25 MED ORDER — LEVOTHYROXINE SODIUM 50 MCG PO TABS
50 | ORAL_TABLET | Freq: Every day | ORAL | 2 refills | 90.00000 days | Status: AC
Start: 2024-08-25 — End: 2025-05-22

## 2024-08-25 NOTE — Progress Notes (Signed)
 "Ashley Carey (DOB: November 29, 1965) is a 58 y.o. female, Established patient patient, here for evaluation of the following chief complaint(s):  Follow-up Chronic Condition       ASSESSMENT/PLAN:  Below is the assessment and plan developed based on review of pertinent history, physical exam, labs, studies, and medications.    1. Elevated serum creatinine  -     Basic Metabolic Panel; Future  2. Prediabetes  -     metFORMIN  (GLUCOPHAGE -XR) 500 MG extended release tablet; Take 1 tablet by mouth daily (with breakfast), Disp-90 tablet, R-1Normal  3. Hypothyroidism, unspecified type  -     levothyroxine  (SYNTHROID ) 50 MCG tablet; Take 1 tablet by mouth daily 1 hour before breakfast, Disp-90 tablet, R-2Normal  4. Hyperlipidemia, unspecified hyperlipidemia type  -     pravastatin  (PRAVACHOL ) 20 MG tablet; Take 1 tablet by mouth daily, Disp-90 tablet, R-2Normal  5. Attention deficit hyperactivity disorder (ADHD), unspecified ADHD type  6. Anxiety  7. Elevated blood pressure reading    Blood pressure mildly elevated , monitor , lifestyle changes  Has an appointment with hematologist due to leucocytosis   Has an appointment for mammogram in January at [place], refused pap smear     No follow-ups on file.         SUBJECTIVE/OBJECTIVE:  HPI    Ashley Carey is a 58 year old who presents to clinic for follow-up.  She has hypothyroidism, anxiety, depression, ADHD.  She follows with psychiatry, she has prediabetes, on metformin  which was being prescribed through an online physician for weight loss. She had to change weight loss clinics, and was started on zepbound , she was started on 2.5 mg dose. She states its expensive to go the weight loss clinic, asking if I can send this refill. Which was refilled, but was too expensive     She has a family hx of breast cancer- mother- refused to get mammograms last visit, last mammogram was  2018.   refused referral for pap smear.     She fractured left big toe in october seen ortho    Allergies   Allergen Reactions    Ammonia      Other Reaction(s): confusion, dizziness, lightheadedness, respiratory distress, ringing in ears    Amoxicillin  Dizziness or Vertigo and Shortness Of Breath     Other Reaction(s): decreased blood pressure, lightheadedness    Cynara Scolymus (Artichoke) Anaphylaxis    Hazelnut Anaphylaxis    Pineapple Extract Diarrhea and Itching    Peanut-Containing Drug Products Swelling    Pcn [Penicillins] Dizziness or Vertigo    Sulfa Antibiotics Diarrhea and Nausea And Vomiting     Current Outpatient Medications   Medication Sig Dispense Refill    metFORMIN  (GLUCOPHAGE -XR) 500 MG extended release tablet Take 1 tablet by mouth daily (with breakfast) 90 tablet 1    levothyroxine  (SYNTHROID ) 50 MCG tablet Take 1 tablet by mouth daily 1 hour before breakfast 90 tablet 2    pravastatin  (PRAVACHOL ) 20 MG tablet Take 1 tablet by mouth daily 90 tablet 2    carisoprodol (SOMA) 350 MG tablet Take 1 tablet by mouth at bedtime.      QUEtiapine (SEROQUEL) 50 MG tablet Take 1 tablet by mouth nightly      lurasidone (LATUDA) 80 MG TABS tablet Take 1 tablet by mouth Daily with supper      amphetamine-dextroamphetamine (ADDERALL XR) 30 MG extended release capsule Take 1 tablet by mouth daily. Max Daily Amount: 1 tablet      buPROPion (WELLBUTRIN  XL) 300 MG extended release tablet Take 1 tablet by mouth every morning      desvenlafaxine succinate (PRISTIQ) 100 MG TB24 extended release tablet Take 1 tablet by mouth daily      desvenlafaxine succinate (PRISTIQ) 50 MG TB24 extended release tablet Take 1 tablet by mouth daily       No current facility-administered medications for this visit.      Past Medical History:   Diagnosis Date    ADHD (attention deficit hyperactivity disorder) 2011    Anxiety     Broken ankle, left, closed, with delayed healing, subsequent encounter 08/15/2023    Depression     Fracture of distal end of fibula 04/25/2022    Hypothyroid     Morbid obesity with BMI of 40.0-44.9,  adult (HCC)     Obesity 1988    Sleep apnea 2014     Past Surgical History:   Procedure Laterality Date    CHOLECYSTECTOMY      EYE SURGERY  2016    FRACTURE SURGERY  2023    OVARY REMOVAL       Family History   Problem Relation Age of Onset    Breast Cancer Mother     Heart Disease Mother     Allergy (Severe) Mother     Depression Mother     Diabetes Mother     Gout Mother     Obesity Mother     Prostate Cancer Father     Coronary Art Dis Father 68 - 38    Heart Disease Father 62 - 43    Other Father     Mental Illness Sister     Depression Sister     Mental Illness Brother     Depression Brother 63 - 57    Cancer Maternal Grandfather     Cancer Maternal Grandmother     Diabetes Maternal Grandmother     Heart Disease Maternal Grandmother     Cancer Paternal Grandfather     Stroke Paternal Grandmother     Breast Cancer Paternal Aunt     Heart Disease Paternal Aunt     Breast Cancer Paternal Aunt     Stroke Maternal Aunt      Social History     Tobacco Use   Smoking Status Every Day    Current packs/day: 1.00    Average packs/day: 1 pack/day for 15.0 years (15.0 ttl pk-yrs)    Types: Cigarettes    Passive exposure: Never   Smokeless Tobacco Never   Tobacco Comments    Vape6 yr         Review of Systems   Constitutional:  Negative for fever.   Respiratory:  Negative for cough and shortness of breath.    Cardiovascular:  Negative for chest pain and palpitations.   Gastrointestinal:  Negative for abdominal pain.   Neurological:  Negative for dizziness, seizures and syncope.   Psychiatric/Behavioral:  Negative for agitation and behavioral problems.            Vitals:    08/25/24 1521 08/25/24 1530   BP: (!) 142/82 (!) 126/90   Pulse: 76    Resp: 20    Temp: 98 F (36.7 C)    TempSrc: Temporal    SpO2: 96%    Weight: 117.5 kg (259 lb)    Height: 1.702 m (5' 7)       Physical Exam  Constitutional:       Appearance: Normal appearance.  Cardiovascular:      Rate and Rhythm: Normal rate and regular rhythm.   Pulmonary:       Effort: Pulmonary effort is normal.      Breath sounds: Normal breath sounds.   Neurological:      General: No focal deficit present.      Mental Status: She is alert. Mental status is at baseline.   Psychiatric:         Mood and Affect: Mood normal.         Behavior: Behavior normal.                 An electronic signature was used to authenticate this note.  -- Charles Golas, MD     "

## 2024-08-25 NOTE — Progress Notes (Signed)
"  Have you been to the ER, urgent care clinic since your last visit?  Hospitalized since your last visit?    Telehealth visit for loose stools    Have you seen or consulted any other health care providers outside of [facility] since your last visit?    Seen by Hematologist on 08/20/2024 for a follow up    Have you had a mammogram?   NO    Date of last Mammogram: 05/29/2017      Have you had a pap smear?    NO    No cervical cancer screening on file       Chief Complaint   Patient presents with    Follow-up Chronic Condition     BP (!) 142/82   Pulse 76   Temp 98 F (36.7 C) (Temporal)   Resp 20   Ht 1.702 m (5' 7)   Wt 117.5 kg (259 lb)   SpO2 96%   BMI 40.57 kg/m         Click Here for Release of Records Request   "
